# Patient Record
Sex: Female | Born: 1995 | Hispanic: No | Marital: Single | Smoking: Never smoker
Health system: Southern US, Community
[De-identification: ages and names within clinical notes are randomized; demographics above are authoritative.]

## PROBLEM LIST (undated history)

## (undated) ENCOUNTER — Inpatient Hospital Stay: Payer: Self-pay

## (undated) DIAGNOSIS — J45909 Unspecified asthma, uncomplicated: Secondary | ICD-10-CM

## (undated) DIAGNOSIS — D649 Anemia, unspecified: Secondary | ICD-10-CM

## (undated) HISTORY — PX: TONSILLECTOMY: SUR1361

---
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
## 2017-04-04 ENCOUNTER — Encounter (HOSPITAL_COMMUNITY): Payer: Self-pay

## 2018-07-17 IMAGING — CT CT RENAL STONE PROTOCOL
3 of 4 series · 7 of 46 positions shown, 13 images · non-contrast
Comparison: None.

CLINICAL DATA: Spotting during early pregnancy, lower abdomen pain

EXAM:
CT ABDOMEN AND PELVIS WITHOUT CONTRAST
TECHNIQUE: Multidetector CT imaging of the abdomen and pelvis was performed
following the standard protocol without IV contrast.

[Series 4: lung bases · axial · 0.68mm/px · z∈[-540,-495]mm · 3 of 19 slices shown, 7 images]
[im 5/19  soft-tissue]
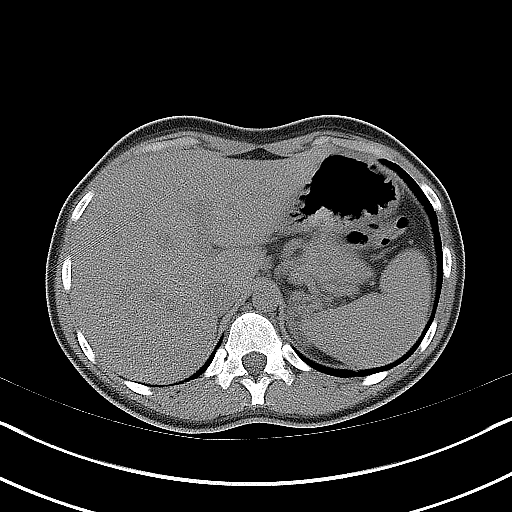
[im 5/19  lung]
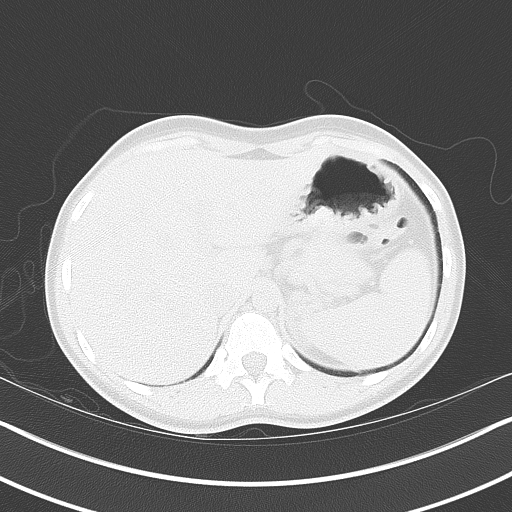
[im 5/19  bone]
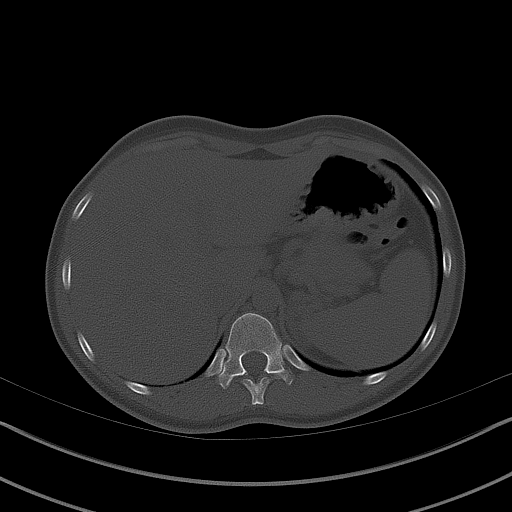
[im 10/19  soft-tissue]
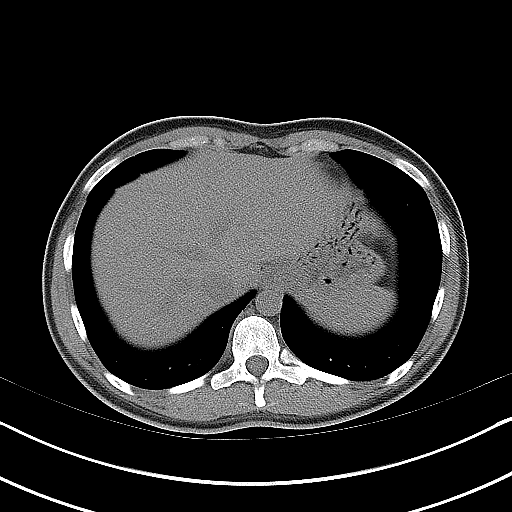
[im 10/19  lung]
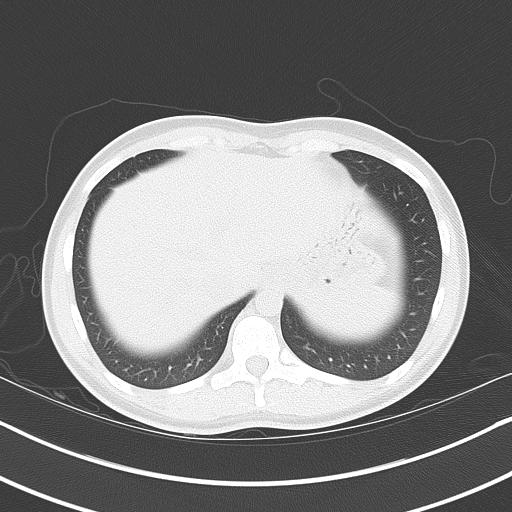
[im 14/19  soft-tissue]
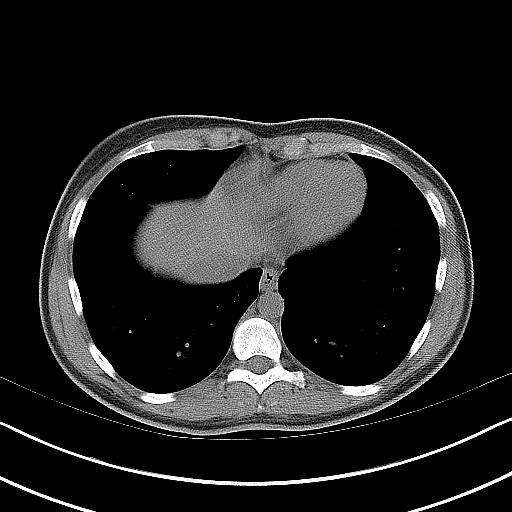
[im 14/19  lung]
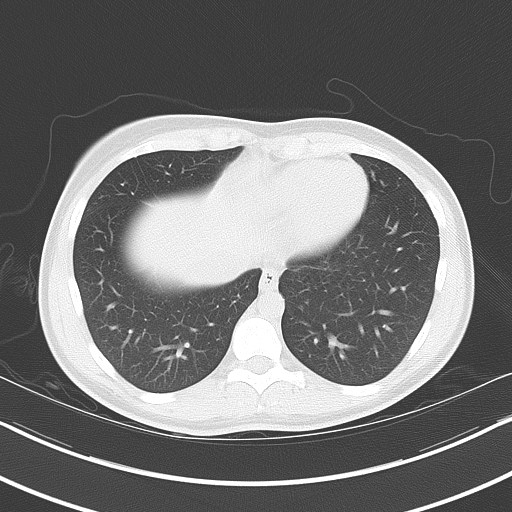

[Series 5: coronal · coronal · 0.74mm/px · 3 of 121 slices shown, 4 images]
[im 41/121  soft-tissue]
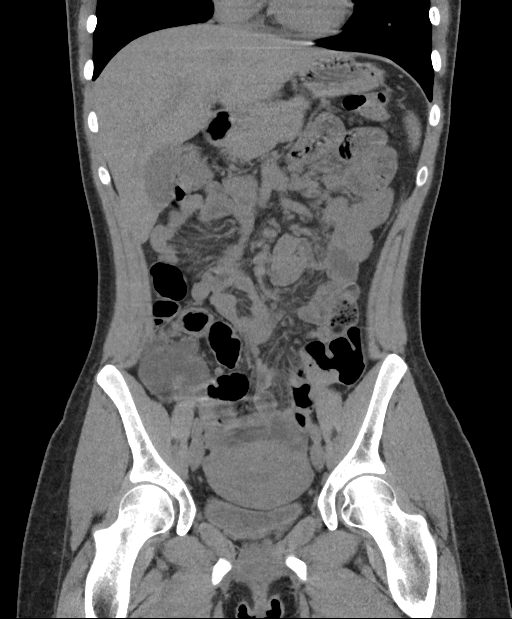
[im 54/121  soft-tissue]
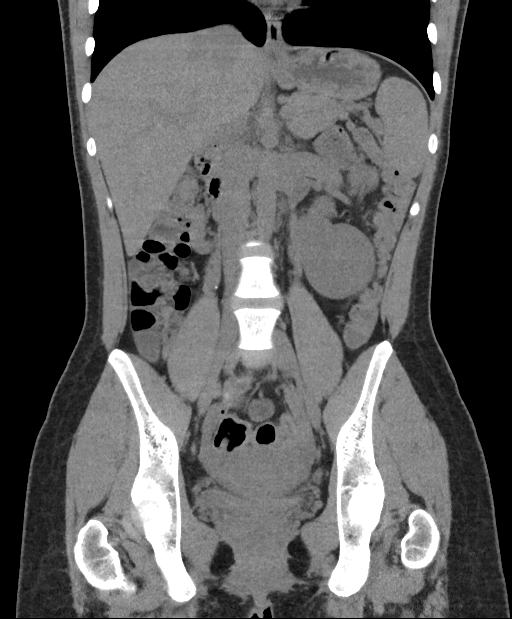
[im 54/121  bone]
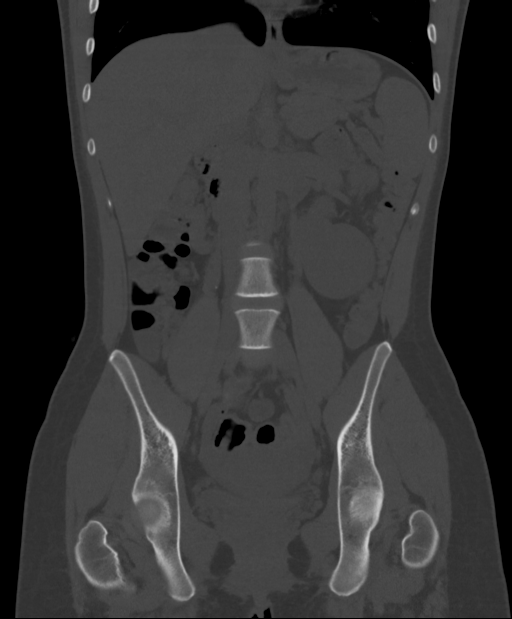
[im 67/121  soft-tissue]
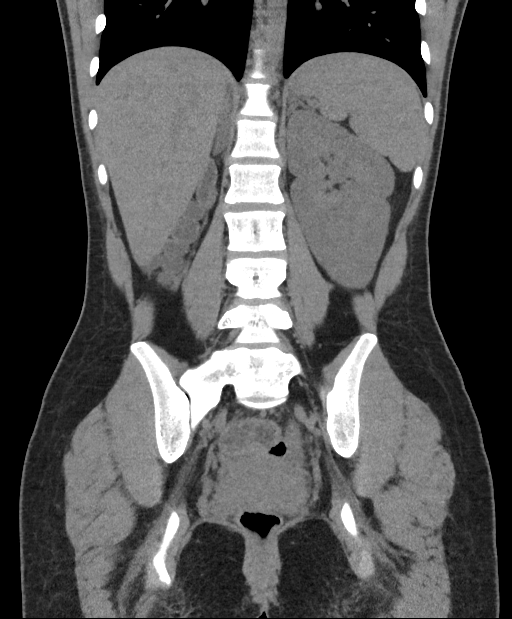

[Series 6: sagittal · sagittal · 0.58mm/px · 1 of 151 slices shown, 2 images]
[im 51/151  soft-tissue]
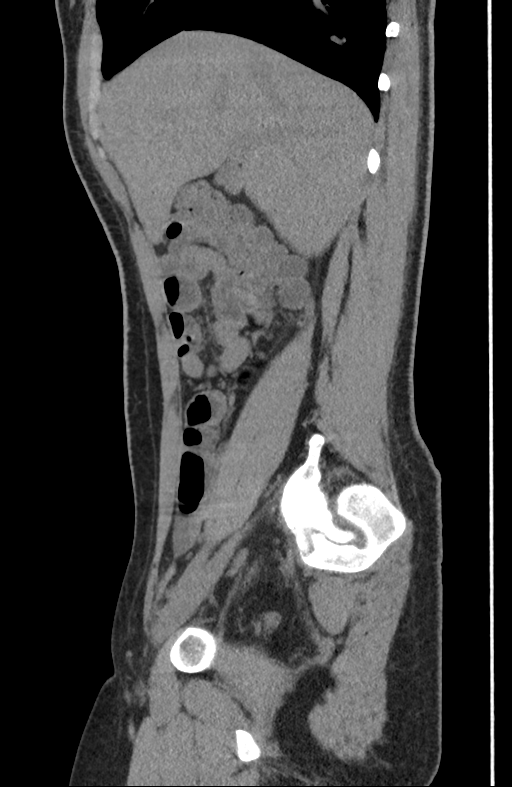
[im 51/151  bone]
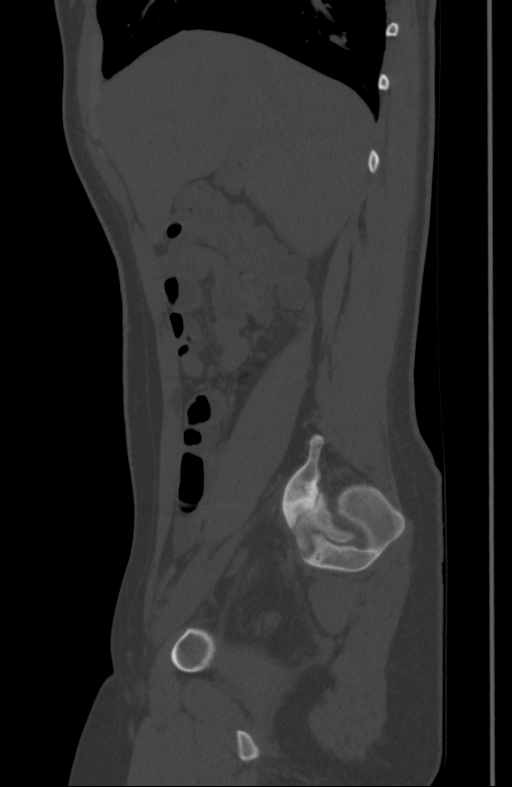

[7 of 46 positions shown; findings below may reference images not displayed]

FINDINGS: Lower chest: The lung bases are clear. The heart is within normal
limits in size.

Hepatobiliary: The liver is unremarkable in the unenhanced state. No
focal abnormality is seen. No calcified gallstones are noted.

Pancreas: The pancreas is normal in size in the pancreatic duct is
not dilated.

Spleen: The spleen is unremarkable.

Adrenals/Urinary Tract: The adrenal glands appear symmetrical and
normal. The left kidney is compensatory hypertrophied and there is
no evidence of hydronephrosis or left renal calculi. There appears
to be a atrophic dysplastic right renal remnant with small cyst
noted in the expected position of the right kidney. The urinary
bladder is decompressed and cannot be evaluated.

Stomach/Bowel: The stomach is not well distended but no abnormality
is seen. No small bowel distention is noted. The colon is largely
decompressed with no definite abnormality noted. The terminal ileum
is unremarkable. The appendix is difficult to visualize but does
appear to be hit normal limits in size within the right pelvis there
is a small amount of fluid within the right adnexa anteriorly which
could be due to a ruptured ovarian cyst. No inflammatory process is
evident.

Vascular/Lymphatic: The abdominal aorta is normal in caliber. No
adenopathy is seen.

Reproductive: The uterus is minimally prominent. Low-attenuation is
noted within the endometrial cavity. No myometrial abnormality is
evident by CT. No adnexal lesion is seen.

Other: None

Musculoskeletal: The lumbar vertebrae are in normal alignment with
normal intervertebral disc spaces.
IMPRESSION: 1. There is a compensatory hypertrophied left kidney present with no
calculi or hydronephrosis. An atrophic multi cystic right renal
remnant is noted.
2. The appendix is difficult to visualize but does appear to be
within normal limits as is the terminal ileum.
3. Small amount of free fluid in the right adnexa could be due to a
ruptured ovarian cyst.
4. Minimal prominence of the uterus with low-attenuation centrally.
No definite adnexal lesion is seen.

## 2018-08-06 IMAGING — CR DG CHEST 2V
2 series · 2 of 2 positions shown · non-contrast
Comparison: None.

CLINICAL DATA: Nonproductive cough and chest tightness

EXAM:
CHEST  2 VIEW

[chest pa]
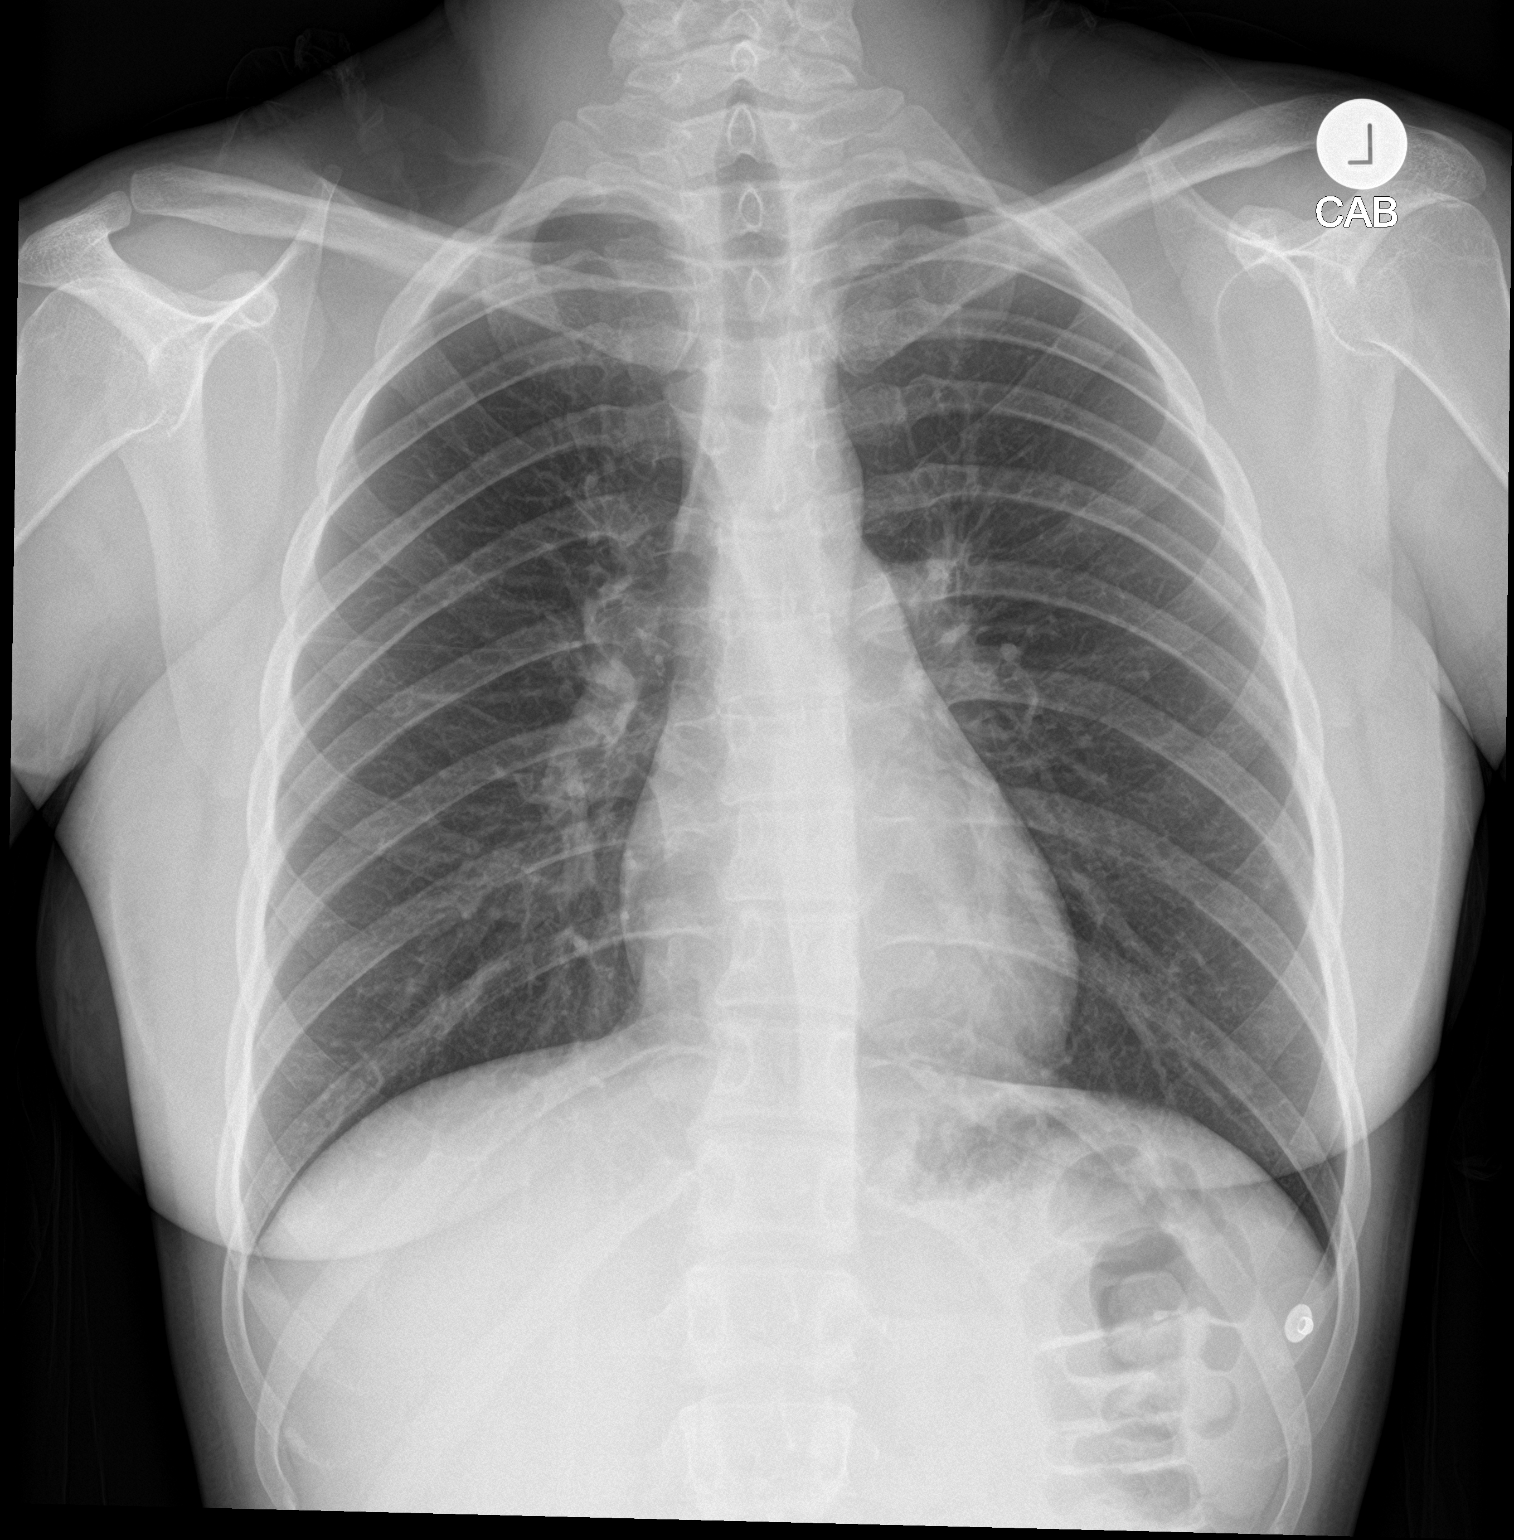

[chest lat]
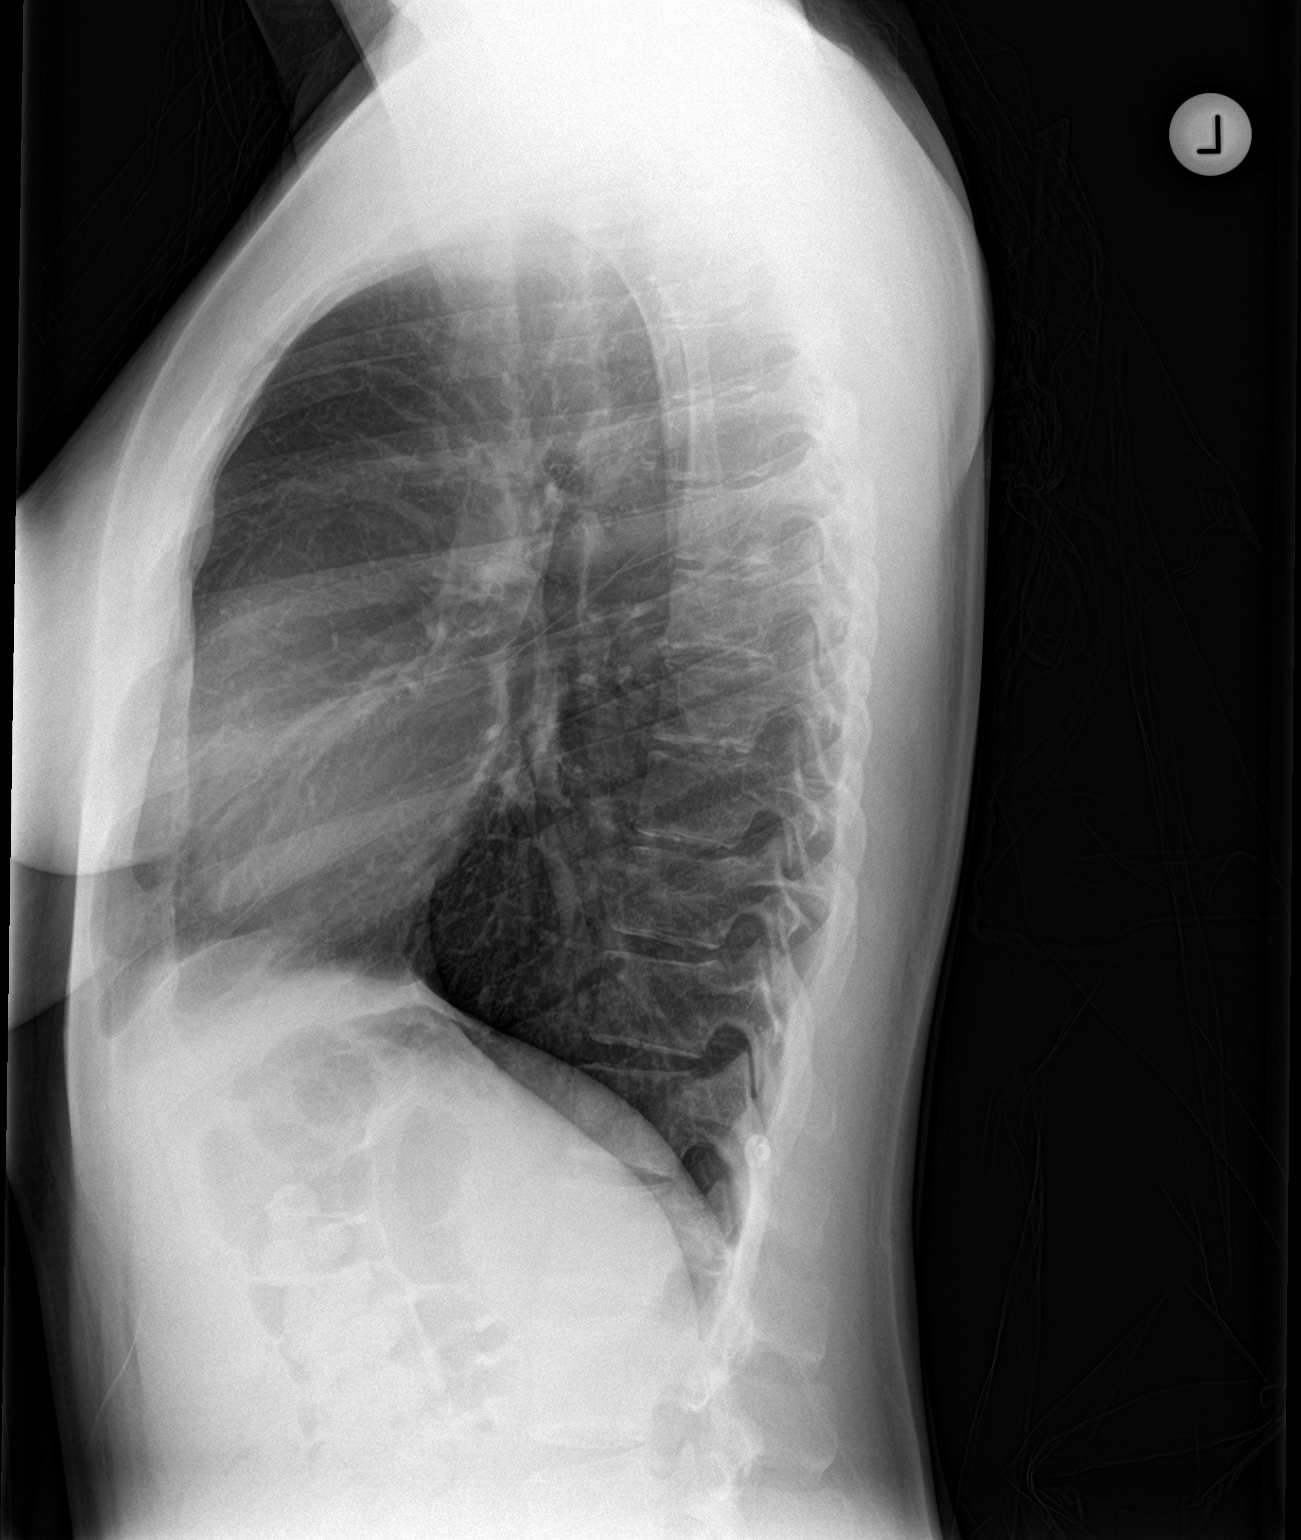

[2 of 2 positions shown; findings below may reference images not displayed]

FINDINGS: Normal heart size and mediastinal contours. No acute infiltrate or
edema. No effusion or pneumothorax. No acute osseous findings.
IMPRESSION: Negative chest.
